# Patient Record
Sex: Female | Born: 1995 | Race: Black or African American | Hispanic: No | Marital: Single | Smoking: Never smoker
Health system: Southern US, Community
[De-identification: ages and names within clinical notes are randomized; demographics above are authoritative.]

## PROBLEM LIST (undated history)

## (undated) DIAGNOSIS — G43909 Migraine, unspecified, not intractable, without status migrainosus: Secondary | ICD-10-CM

## (undated) DIAGNOSIS — N809 Endometriosis, unspecified: Secondary | ICD-10-CM

## (undated) HISTORY — PX: ADENOIDECTOMY: SUR15

## (undated) HISTORY — PX: TONSILLECTOMY: SUR1361

---
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
## 2014-05-27 ENCOUNTER — Encounter (HOSPITAL_COMMUNITY): Payer: Self-pay | Admitting: Emergency Medicine

## 2014-05-27 ENCOUNTER — Emergency Department (HOSPITAL_COMMUNITY)
Admission: EM | Admit: 2014-05-27 | Discharge: 2014-05-27 | Disposition: A | Discharge status: Home / Self Care | Payer: 59 | Attending: Emergency Medicine | Admitting: Emergency Medicine

## 2014-05-27 ENCOUNTER — Emergency Department (HOSPITAL_COMMUNITY): Payer: 59

## 2014-05-27 DIAGNOSIS — Z79899 Other long term (current) drug therapy: Secondary | ICD-10-CM | POA: Diagnosis not present

## 2014-05-27 DIAGNOSIS — M25511 Pain in right shoulder: Secondary | ICD-10-CM

## 2014-05-27 DIAGNOSIS — G43909 Migraine, unspecified, not intractable, without status migrainosus: Secondary | ICD-10-CM | POA: Insufficient documentation

## 2014-05-27 DIAGNOSIS — Z3202 Encounter for pregnancy test, result negative: Secondary | ICD-10-CM | POA: Insufficient documentation

## 2014-05-27 LAB — POC URINE PREG, ED: PREG TEST UR: NEGATIVE

## 2014-05-27 MED ORDER — HYDROCODONE-ACETAMINOPHEN 5-325 MG PO TABS: 1.0000 | ORAL_TABLET | ORAL | Status: DC | PRN

## 2014-05-27 MED ORDER — HYDROCODONE-ACETAMINOPHEN 5-325 MG PO TABS
1.0000 | ORAL_TABLET | Freq: Once | ORAL | Status: AC
  Administered 2014-05-27: 1 via ORAL
  Filled 2014-05-27: qty 1

## 2014-05-27 MED ORDER — KETOROLAC TROMETHAMINE 10 MG PO TABS
10.0000 mg | ORAL_TABLET | Freq: Once | ORAL | Status: AC
  Administered 2014-05-27: 10 mg via ORAL
  Filled 2014-05-27: qty 1

## 2014-05-27 MED ORDER — CELECOXIB 100 MG PO CAPS: 100.0000 mg | ORAL_CAPSULE | Freq: Two times a day (BID) | ORAL | Status: DC

## 2014-05-27 NOTE — ED Notes (Signed)
EDPa in to see pt for initial eval.

## 2014-05-27 NOTE — ED Notes (Signed)
Pt reports was changing a tire and reports right shoulder pain ever since. nad noted.

## 2014-05-27 NOTE — Discharge Instructions (Signed)
Your xray is negative for acute problem. Please see Dr Luiz BlareGraves, or the orthopedic MD of your choice for additional evaluation and management of your right shoulder pain. Shoulder Pain The shoulder is the joint that connects your arms to your body. The bones that form the shoulder joint include the upper arm bone (humerus), the shoulder blade (scapula), and the collarbone (clavicle). The top of the humerus is shaped like a ball and fits into a rather flat socket on the scapula (glenoid cavity). A combination of muscles and strong, fibrous tissues that connect muscles to bones (tendons) support your shoulder joint and hold the ball in the socket. Small, fluid-filled sacs (bursae) are located in different areas of the joint. They act as cushions between the bones and the overlying soft tissues and help reduce friction between the gliding tendons and the bone as you move your arm. Your shoulder joint allows a wide range of motion in your arm. This range of motion allows you to do things like scratch your back or throw a ball. However, this range of motion also makes your shoulder more prone to pain from overuse and injury. Causes of shoulder pain can originate from both injury and overuse and usually can be grouped in the following four categories:  Redness, swelling, and pain (inflammation) of the tendon (tendinitis) or the bursae (bursitis).  Instability, such as a dislocation of the joint.  Inflammation of the joint (arthritis).  Broken bone (fracture). HOME CARE INSTRUCTIONS   Apply ice to the sore area.  Put ice in a plastic bag.  Place a towel between your skin and the bag.  Leave the ice on for 15-20 minutes, 3-4 times per day for the first 2 days, or as directed by your health care provider.  Stop using cold packs if they do not help with the pain.  If you have a shoulder sling or immobilizer, wear it as long as your caregiver instructs. Only remove it to shower or bathe. Move your arm as  little as possible, but keep your hand moving to prevent swelling.  Squeeze a soft ball or foam pad as much as possible to help prevent swelling.  Only take over-the-counter or prescription medicines for pain, discomfort, or fever as directed by your caregiver. SEEK MEDICAL CARE IF:   Your shoulder pain increases, or new pain develops in your arm, hand, or fingers.  Your hand or fingers become cold and numb.  Your pain is not relieved with medicines. SEEK IMMEDIATE MEDICAL CARE IF:   Your arm, hand, or fingers are numb or tingling.  Your arm, hand, or fingers are significantly swollen or turn white or blue. MAKE SURE YOU:   Understand these instructions.  Will watch your condition.  Will get help right away if you are not doing well or get worse. Document Released: 01/07/2005 Document Revised: 08/14/2013 Document Reviewed: 03/14/2011 [facility] Patient Information 2015 [employer] [place]. This information is not intended to replace advice given to you by your health care provider. Make sure you discuss any questions you have with your health care provider.

## 2014-05-27 NOTE — ED Provider Notes (Signed)
CSN: [id number]     Arrival date & time 05/27/14  1646 History  This chart was scribed for non-physician practitioner, Lyla SonHobson M. Danae OrleansBryant, PA-C, working with Hilario Quarryanielle S Ray, MD, by Roxy Cedarhandni Bhalodia ED Scribe. This patient was seen in room [facility] and the patient's care was started at 5:31 PM    Chief Complaint  Patient presents with  . Shoulder Pain   Patient is a 19 y.o. female presenting with shoulder pain. The history is provided by the patient. No language interpreter was used.  Shoulder Pain Location:  Shoulder Time since incident:  2 weeks Injury: no   Shoulder location:  R shoulder Pain details:    Quality:  Aching   Radiates to:  Does not radiate   Severity:  Moderate   Onset quality:  Gradual   Timing:  Constant   Progression:  Waxing and waning Chronicity:  New Dislocation: no   Foreign body present:  No foreign bodies Relieved by:  Nothing Worsened by:  Nothing tried Ineffective treatments:  None tried Associated symptoms: decreased range of motion    HPI Comments: Ariana Schroeder is a 19 y.o. female with a PMHx of migraine headaches, who presents to the Emergency Department complaining of moderate right shoulder pain that began earlier today. Patient states that she was helping to change a car tire 2 weeks ago and her arm kept jerking. She states that the pain is exacerbated by movement. Patient states that she was seen in [place] in [place] and was diagnosed with bursitis.  She states that the injection medication gave no relief.  Past Medical History  Diagnosis Date  . Migraines    Past Surgical History  Procedure Laterality Date  . Tonsillectomy    . Adenoidectomy     History reviewed. No pertinent family history. History  Substance Use Topics  . Smoking status: Never Smoker   . Smokeless tobacco: Not on file  . Alcohol Use: No   OB History    No data available     Review of Systems  Musculoskeletal: Positive for arthralgias.  All other systems reviewed  and are negative.  Allergies  Review of patient's allergies indicates no known allergies.  Home Medications   Prior to Admission medications   Medication Sig Start Date End Date Taking? Authorizing Provider  ibuprofen (ADVIL,MOTRIN) 200 MG tablet Take 400 mg by mouth every 6 (six) hours as needed for mild pain.   Yes Historical Provider, MD  propranolol (INDERAL) 80 MG tablet Take 80 mg by mouth daily.   Yes Historical Provider, MD  doxycycline (VIBRAMYCIN) 100 MG capsule Take 1 capsule (100 mg total) by mouth 2 (two) times daily. Patient not taking: Reported on 05/27/2014 03/05/12   Evalina Fieldichard Miller, PA-C  HYDROcodone-acetaminophen (NORCO/VICODIN) 5-325 MG per tablet Take 1 tablet by mouth every 4 (four) hours as needed for moderate pain. Patient not taking: Reported on 05/27/2014 09/17/13   Burgess AmorJulie Idol, PA-C   Triage Vitals: BP 124/72 mmHg  Pulse 93  Temp(Src) 98.1 F (36.7 C) (Oral)  Resp 24  Ht 5\' 3"  (1.6 m)  Wt 220 lb (99.791 kg)  BMI 38.98 kg/m2  SpO2 100%  LMP 05/24/2014  Physical Exam  Constitutional: She appears well-developed and well-nourished.  HENT:  Head: Normocephalic and atraumatic.  Eyes: Conjunctivae are normal. Right eye exhibits no discharge. Left eye exhibits no discharge.  Neck:  Full range of motion of neck.  Pulmonary/Chest: Effort normal. No respiratory distress.  Musculoskeletal: She exhibits tenderness.  Capillary  refill is <2 sec bilaterally Pain with range of motion of the right shoulder. Pain with palpation of anterior and posterior right shoulder. No dislocation. Full range of motion of the elbow. Full range of motion of the wrist.   Neurological: She is alert. Coordination normal.  Grip is symmetrical. Motor stregnth of upper extremities is symmetrical.  Skin: Skin is warm and dry. No rash noted. She is not diaphoretic. No erythema.  Psychiatric: She has a normal mood and affect.  Nursing note and vitals reviewed.  ED Course  Procedures (including  critical care time)  DIAGNOSTIC STUDIES: Oxygen Saturation is 100% on RA, normal by my interpretation.    COORDINATION OF CARE: 5:38 PM- Discussed plans to order diagnostic imaging of right shoulder. Pt advised of plan for treatment and pt agrees.  7:16 PM- Xray of the shoulder is negative.  Labs Review Labs Reviewed - No data to display  Imaging Review No results found.   EKG Interpretation None     MDM  No fever. Xray of the right shoulder is negative. Question shoulder strain, unknown trauma, rotator cuff issue. Pt placed in sling. Referral to Dr Luiz Blare, orthopedics. Rx for celebrex and norco given to the patient.   Final diagnoses:  Shoulder pain, right    *I have reviewed nursing notes, vital signs, and all appropriate lab and imaging results for this patient.   **I personally performed the services described in this documentation, which was scribed in my presence. The recorded information has been reviewed and is accurate.Kathie Dike, PA-C 05/27/14 1958

## 2016-02-14 ENCOUNTER — Ambulatory Visit (INDEPENDENT_AMBULATORY_CARE_PROVIDER_SITE_OTHER): Payer: Medicaid Other

## 2016-02-14 ENCOUNTER — Ambulatory Visit (HOSPITAL_COMMUNITY): Payer: Self-pay

## 2016-02-14 ENCOUNTER — Encounter (HOSPITAL_COMMUNITY): Payer: Self-pay | Admitting: *Deleted

## 2016-02-14 ENCOUNTER — Ambulatory Visit (HOSPITAL_COMMUNITY)
Admission: EM | Admit: 2016-02-14 | Discharge: 2016-02-14 | Disposition: A | Discharge status: Home / Self Care | Payer: Self-pay | Attending: Family Medicine | Admitting: Family Medicine

## 2016-02-14 ENCOUNTER — Ambulatory Visit (INDEPENDENT_AMBULATORY_CARE_PROVIDER_SITE_OTHER): Payer: Self-pay

## 2016-02-14 DIAGNOSIS — M79641 Pain in right hand: Secondary | ICD-10-CM

## 2016-02-14 DIAGNOSIS — S60221A Contusion of right hand, initial encounter: Secondary | ICD-10-CM

## 2016-02-14 MED ORDER — PREDNISONE 20 MG PO TABS: ORAL_TABLET | ORAL | 0 refills | Status: DC

## 2016-02-14 NOTE — ED Triage Notes (Signed)
Pt  Reports     Pain  And   Swelling of  The  l  Hand      She  Reports  It  Got  Caught   In a  Car  Door   sev   Weeks  Ago     The pt  Is  Sitting     Upright        On  Exam table  Appearing in no  Acute  Distress

## 2016-02-14 NOTE — ED Provider Notes (Signed)
[facility]    CSN: [id number] Arrival date & time: 02/14/16  1816     History   Chief Complaint Chief Complaint  Patient presents with  . Hand Injury    HPI Ariana Schroeder is a 20 y.o. female.   This is a 20 year old woman who presents with left hand pain and swelling about 2 weeks after she caught her hand in a house door.  The pain is been persistent ever since the injury. Location of the pain is mainly on the dorsum of the hand over the second through fourth metacarpals. She has pain when she moves her fingers but she is able to movement all directions completely. She is also hurting in the dorsal wrist. She has tried ibuprofen but this is not related to pain.  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      Past Medical History:  Diagnosis Date  . Migraines     There are no active problems to display for this patient.   Past Surgical History:  Procedure Laterality Date  . ADENOIDECTOMY    . TONSILLECTOMY      OB History    No data available       Home Medications    Prior to Admission medications   Medication Sig Start Date End Date Taking? Authorizing Provider  predniSONE (DELTASONE) 20 MG tablet Two daily with food 02/14/16   Elvina SidleKurt Coreon Simkins, MD  propranolol (INDERAL) 80 MG tablet Take 80 mg by mouth daily.    Historical Provider, MD    Family History History reviewed. No pertinent family history.  Social History Social History  Substance Use Topics  . Smoking status: Never Smoker  . Smokeless tobacco: Not on file  . Alcohol use No     Allergies   Review of patient's allergies indicates no known allergies.   Review of Systems Review of Systems  Constitutional: Negative.   HENT: Negative.   Respiratory: Negative.   Cardiovascular: Negative.   Gastrointestinal: Negative.   Musculoskeletal: Positive for joint swelling.     Physical Exam Triage Vital Signs ED Triage Vitals  Enc Vitals  Group     BP 02/14/16 1835 120/60     Pulse Rate 02/14/16 1835 82     Resp 02/14/16 1835 18     Temp 02/14/16 1835 98.6 F (37 C)     Temp Source 02/14/16 1835 Oral     SpO2 02/14/16 1835 99 %     Weight --      Height --      Head Circumference --      Peak Flow --      Pain Score 02/14/16 1834 6     Pain Loc --      Pain Edu? --      Excl. in GC? --    No data found.   Updated Vital Signs BP 120/60 (BP Location: Right Arm)   Pulse 82   Temp 98.6 F (37 C) (Oral)   Resp 18   SpO2 99%      Physical Exam  Constitutional: She is oriented to person, place, and time. She appears well-developed and well-nourished.  HENT:  Head: Normocephalic.  Right Ear: External ear normal.  Left Ear: External ear normal.  Nose: Nose normal.  Mouth/Throat: Oropharynx is clear and moist.  Eyes: Conjunctivae and EOM are normal. Pupils are equal, round, and reactive to light.  Neck: Normal range of motion. Neck supple.  Pulmonary/Chest: Effort normal.  Musculoskeletal: Normal range of motion. She exhibits tenderness. She exhibits no deformity.  Tender right dorsal wrist and hand over the second through fourth metacarpals. Patient has pain with range of motion but is able to extend her fingers and flex them completely. There is no ecchymosis and no injury to the skin.  Neurological: She is alert and oriented to person, place, and time.  Nursing note and vitals reviewed.    UC Treatments / Results  Labs (all labs ordered are listed, but only abnormal results are displayed) Labs Reviewed - No data to display  EKG  EKG Interpretation None       Radiology Dg Wrist Complete Left  Result Date: 02/14/2016 CLINICAL DATA:  Initial evaluation for her persistent left hand and wrist pain status post injury 2 weeks ago. EXAM: LEFT WRIST - COMPLETE 3+ VIEW COMPARISON:  None. FINDINGS: There is no evidence of fracture or dislocation. Scaphoid intact. There is no evidence of arthropathy or other  focal bone abnormality. Soft tissues are unremarkable. IMPRESSION: No acute or subacute osseous abnormality about the left wrist. Electronically Signed   By: Rise MuBenjamin  McClintock M.D.   On: 02/14/2016 19:14   Dg Hand Complete Left  Result Date: 02/14/2016 CLINICAL DATA:  Initial evaluation for persistent left hand and wrist pain status post injury 2 weeks ago. EXAM: LEFT HAND - COMPLETE 3+ VIEW COMPARISON:  Prior radiograph from 01/04/2016. FINDINGS: There is no evidence of fracture or dislocation. There is no evidence of arthropathy or other focal bone abnormality. Soft tissues are unremarkable. IMPRESSION: No acute or subacute injury about the left hand. Electronically Signed   By: Rise MuBenjamin  McClintock M.D.   On: 02/14/2016 19:06    Procedures Procedures (including critical care time)  Medications Ordered in UC Medications - No data to display   Initial Impression / Assessment and Plan / UC Course  I have reviewed the triage vital signs and the nursing notes.  Pertinent labs & imaging results that were available during my care of the patient were reviewed by me and considered in my medical decision making (see chart for details).  Clinical Course    Final Clinical Impressions(s) / UC Diagnoses   Final diagnoses:  Right hand pain  Contusion of right hand, initial encounter    New Prescriptions New Prescriptions   PREDNISONE (DELTASONE) 20 MG TABLET    Two daily with food     Elvina SidleKurt Luwanda Starr, MD 02/14/16 Ernestina [id number]

## 2016-08-01 ENCOUNTER — Emergency Department (HOSPITAL_COMMUNITY)
Admission: EM | Admit: 2016-08-01 | Discharge: 2016-08-01 | Disposition: A | Discharge status: Home Health | Payer: No Typology Code available for payment source | Attending: Emergency Medicine | Admitting: Emergency Medicine

## 2016-08-01 ENCOUNTER — Encounter (HOSPITAL_COMMUNITY): Payer: Self-pay

## 2016-08-01 ENCOUNTER — Emergency Department (HOSPITAL_COMMUNITY): Payer: No Typology Code available for payment source

## 2016-08-01 DIAGNOSIS — Z79899 Other long term (current) drug therapy: Secondary | ICD-10-CM | POA: Insufficient documentation

## 2016-08-01 DIAGNOSIS — M25561 Pain in right knee: Secondary | ICD-10-CM | POA: Diagnosis not present

## 2016-08-01 DIAGNOSIS — Y999 Unspecified external cause status: Secondary | ICD-10-CM | POA: Insufficient documentation

## 2016-08-01 DIAGNOSIS — S8991XA Unspecified injury of right lower leg, initial encounter: Secondary | ICD-10-CM | POA: Diagnosis present

## 2016-08-01 DIAGNOSIS — Y9367 Activity, basketball: Secondary | ICD-10-CM | POA: Diagnosis not present

## 2016-08-01 DIAGNOSIS — X501XXA Overexertion from prolonged static or awkward postures, initial encounter: Secondary | ICD-10-CM | POA: Insufficient documentation

## 2016-08-01 DIAGNOSIS — Y929 Unspecified place or not applicable: Secondary | ICD-10-CM | POA: Insufficient documentation

## 2016-08-01 MED ORDER — DICLOFENAC SODIUM 75 MG PO TBEC: 75.0000 mg | DELAYED_RELEASE_TABLET | Freq: Two times a day (BID) | ORAL | 0 refills | Status: DC

## 2016-08-01 NOTE — Discharge Instructions (Signed)
Wear the knee sleeve with walking and weight bearing.  Apply ice packs on/off to your knee for the next 3-4 days.  Follow-up with your orthopedic doctor for recheck in one week if not improving.  Do not take aspirin, aleve, or ibuprofen while taking the prescription medication

## 2016-08-01 NOTE — ED Provider Notes (Signed)
[facility] Note   CSN: [id number] Arrival date & time: 08/01/16  0907     History   Chief Complaint No chief complaint on file.   HPI Ariana Schroeder is a 21 y.o. female.  HPI   Ariana Schroeder is a 21 y.o. female who presents to the Emergency Department complaining of Right knee pain for one week. She states that she was playing basketball and when she went up for a shot, she came down on her right leg and may have twisted her knee. She denies fall. She describes a throbbing pain to her right knee with weightbearing and bending.  she has been taking ibuprofen without relief. She reports swelling initially, but has subsided. She denies numbness or weakness of the extremity, ankle pain, or swelling of the knee at this time.   Past Medical History:  Diagnosis Date  . Migraines     There are no active problems to display for this patient.   Past Surgical History:  Procedure Laterality Date  . ADENOIDECTOMY    . TONSILLECTOMY      OB History    No data available       Home Medications    Prior to Admission medications   Medication Sig Start Date End Date Taking? Authorizing Provider  predniSONE (DELTASONE) 20 MG tablet Two daily with food 02/14/16   Elvina Sidle, MD  propranolol (INDERAL) 80 MG tablet Take 80 mg by mouth daily.    Historical Provider, MD    Family History No family history on file.  Social History Social History  Substance Use Topics  . Smoking status: Never Smoker  . Smokeless tobacco: Not on file  . Alcohol use No     Allergies   Patient has no known allergies.   Review of Systems Review of Systems  Constitutional: Negative for chills and fever.  Musculoskeletal: Positive for arthralgias (Right knee pain). Negative for back pain and joint swelling.  Skin: Negative for color change and wound.  Neurological: Negative for weakness and numbness.  All other systems reviewed and are negative.    Physical  Exam Updated Vital Signs BP 116/73 (BP Location: Right Arm)   Pulse 99   Temp 99.4 F (37.4 C) (Oral)   Resp 20   Ht  (1.6 m)   Wt 97.5 kg   SpO2 100%   BMI 38.09 kg/m   Physical Exam  Constitutional: She is oriented to person, place, and time. She appears well-developed and well-nourished. No distress.  HENT:  Head: Atraumatic.  Cardiovascular: Normal rate, regular rhythm, normal heart sounds and intact distal pulses.   Pulmonary/Chest: Effort normal and breath sounds normal.  Musculoskeletal: She exhibits tenderness. She exhibits no edema or deformity.  ttp of the anterior and lateral right knee.  No erythema, effusion, or step-off deformity.   Calf is soft and NT.  Neurological: She is alert and oriented to person, place, and time. She exhibits normal muscle tone. Coordination normal.  Skin: Skin is warm and dry. Capillary refill takes less than 2 seconds. No erythema.  Nursing note and vitals reviewed.    ED Treatments / Results  Labs (all labs ordered are listed, but only abnormal results are displayed) Labs Reviewed - No data to display  EKG  EKG Interpretation None       Radiology Dg Knee Complete 4 Views Right  Result Date: 08/01/2016 CLINICAL DATA:  21 year old female with right knee pain after playing basketball EXAM: RIGHT KNEE -  COMPLETE 4+ VIEW COMPARISON:  None. FINDINGS: No evidence of fracture, dislocation, or joint effusion. No evidence of arthropathy or other focal bone abnormality. Soft tissues are unremarkable. IMPRESSION: Negative. Electronically Signed   By: Malachy Moan M.D.   On: 08/01/2016 10:20    Procedures Procedures (including critical care time)  Medications Ordered in ED Medications - No data to display   Initial Impression / Assessment and Plan / ED Course  I have reviewed the triage vital signs and the nursing notes.  Pertinent labs & imaging results that were available during my care of the patient were reviewed by me  and considered in my medical decision making (see chart for details).     X-ray negative for fracture, no palpable effusion of the joint. No concerning symptoms for septic joint. Compartments are soft.   Knee sleeve applied by nursing staff.  Patient agrees to RICE therapy and agrees to orthopedic follow-up, she prefers to follow-up with her orthopedist in [facility]  Final Clinical Impressions(s) / ED Diagnoses   Final diagnoses:  Acute pain of right knee    New Prescriptions New Prescriptions   No medications on file     Rosey Bath 08/01/16 2023    Maia Plan, MD 08/02/16 1015

## 2016-08-01 NOTE — ED Triage Notes (Signed)
Pt. Complains of knee pain after playing basketball last Saturday. When she came down on her right knee she heard a snap. Hasn't seen a provider until now. Pt. Didn't fall. The pain is felt when she bends it and straightens it. Right leg and foot was swollen .

## 2016-11-13 ENCOUNTER — Emergency Department (HOSPITAL_COMMUNITY)
Admission: EM | Admit: 2016-11-13 | Discharge: 2016-11-13 | Disposition: A | Discharge status: Home / Self Care | Payer: No Typology Code available for payment source | Attending: Emergency Medicine | Admitting: Emergency Medicine

## 2016-11-13 ENCOUNTER — Encounter (HOSPITAL_COMMUNITY): Payer: Self-pay | Admitting: Emergency Medicine

## 2016-11-13 ENCOUNTER — Emergency Department (HOSPITAL_COMMUNITY): Payer: No Typology Code available for payment source

## 2016-11-13 DIAGNOSIS — M25561 Pain in right knee: Secondary | ICD-10-CM | POA: Insufficient documentation

## 2016-11-13 DIAGNOSIS — W1849XA Other slipping, tripping and stumbling without falling, initial encounter: Secondary | ICD-10-CM | POA: Insufficient documentation

## 2016-11-13 DIAGNOSIS — Z79899 Other long term (current) drug therapy: Secondary | ICD-10-CM | POA: Diagnosis not present

## 2016-11-13 MED ORDER — IBUPROFEN 800 MG PO TABS
800.0000 mg | ORAL_TABLET | Freq: Once | ORAL | Status: AC
  Administered 2016-11-13: 800 mg via ORAL
  Filled 2016-11-13: qty 1

## 2016-11-13 MED ORDER — IBUPROFEN 800 MG PO TABS: 800.0000 mg | ORAL_TABLET | Freq: Three times a day (TID) | ORAL | 0 refills | Status: DC

## 2016-11-13 NOTE — ED Triage Notes (Signed)
Pt c/o right knee pain after fall yesterday. Pt is ambulatory in triage.

## 2016-11-13 NOTE — ED Triage Notes (Signed)
Slid in mud yesterday and now R knee pain  [facility] is  PCP

## 2016-11-13 NOTE — Discharge Instructions (Signed)
Use your crutches for weight bearing.  Apply ice packs on/off to your knee.  Wear the knee sleeve for support when walking or standing.  Do not wear it continuously or at bedtime.  Call Dr. Sanjuan DameKeeling's office to arrange a follow-up appt in one week if not improving

## 2016-11-13 NOTE — ED Provider Notes (Signed)
[facility]EM[facility] Provider Note   CSN: [id number] Arrival date & time: 11/13/16  2000     History   Chief Complaint Chief Complaint  Patient presents with  . Knee Pain    HPI Ariana Schroeder is a 21 y.o. female.  HPI   Ariana Schroeder is a 21 y.o. female who presents to the Emergency Department complaining of right knee pain for one day.  She states that she slipped in the mud yesterday and "twisted" her knee.  She reports gradually worsening pain to the knee with weight bearing and full extension.  She describes a pulling sensation to the outside of the knee.  Pain improves at rest with the knee slightly bent.  She has tried tylenol and ice without relief.  She denies numbness, swelling, calf or thigh pain, and other injuries.   Past Medical History:  Diagnosis Date  . Migraines     There are no active problems to display for this patient.   Past Surgical History:  Procedure Laterality Date  . ADENOIDECTOMY    . TONSILLECTOMY      OB History    No data available       Home Medications    Prior to Admission medications   Medication Sig Start Date End Date Taking? Authorizing Provider  diclofenac (VOLTAREN) 75 MG EC tablet Take 1 tablet (75 mg total) by mouth 2 (two) times daily. Take with food 08/01/16   Ivelise Castillo, PA-C  predniSONE (DELTASONE) 20 MG tablet Two daily with food 02/14/16   Elvina SidleLauenstein, Kurt, MD  propranolol (INDERAL) 80 MG tablet Take 80 mg by mouth daily.    [provider]    Family History History reviewed. No pertinent family history.  Social History Social History  Substance Use Topics  . Smoking status: Never Smoker  . Smokeless tobacco: Never Used  . Alcohol use No     Allergies   Patient has no known allergies.   Review of Systems Review of Systems  Constitutional: Negative for chills and fever.  Genitourinary: Negative for difficulty urinating and dysuria.  Musculoskeletal: Positive for arthralgias (right knee  pain). Negative for joint swelling.  Skin: Negative for color change and wound.  Neurological: Negative for weakness and numbness.  All other systems reviewed and are negative.    Physical Exam Updated Vital Signs BP 120/70 (BP Location: Right Arm)   Pulse 94   Temp 98.5 F (36.9 C) (Oral)   Resp 16   Ht 5\' 3"  (1.6 m)   Wt 99.8 kg (220 lb)   SpO2 99%   BMI 38.97 kg/m   Physical Exam  Constitutional: She is oriented to person, place, and time. She appears well-developed and well-nourished. No distress.  Cardiovascular: Normal rate, regular rhythm and intact distal pulses.   Pulmonary/Chest: Effort normal and breath sounds normal.  Musculoskeletal: She exhibits tenderness. She exhibits no edema or deformity.  ttp of the anterolateral right knee.  No edema, erythema, effusion, or step-off deformity. Neg anterior drawer test. DP pulse brisk, distal sensation intact. Calf is soft and NT.  Neurological: She is alert and oriented to person, place, and time. No sensory deficit. She exhibits normal muscle tone. Coordination normal.  Skin: Skin is warm and dry. Capillary refill takes less than 2 seconds. No erythema.  Nursing note and vitals reviewed.    ED Treatments / Results  Labs (all labs ordered are listed, but only abnormal results are displayed) Labs Reviewed - No data to display  EKG  EKG Interpretation None       Radiology Dg Knee Complete 4 Views Right  Result Date: 11/13/2016 CLINICAL DATA:  Right knee pain since a fall yesterday. Initial encounter. EXAM: RIGHT KNEE - COMPLETE 4+ VIEW COMPARISON:  Plain films right knee 08/01/2016. MRI right knee 10/09/2016. FINDINGS: No evidence of fracture, dislocation, or joint effusion. No evidence of arthropathy or other focal bone abnormality. Soft tissues are unremarkable. IMPRESSION: Normal exam. Electronically Signed   By: Drusilla Kannerhomas  Dalessio M.D.   On: 11/13/2016 20:41    Procedures Procedures (including critical care  time)  Medications Ordered in ED Medications - No data to display   Initial Impression / Assessment and Plan / ED Course  I have reviewed the triage vital signs and the nursing notes.  Pertinent labs & imaging results that were available during my care of the patient were reviewed by me and considered in my medical decision making (see chart for details).     XR Negative for fracture. Neurovascularly intact. No effusion or bony deformity on exam. Likely sprain. Possible meniscal injury also discussed. Knee sleeve applied, patient has crutches at home. She agrees to RICE therapy and close orthopedic follow-up if not improving.   Final Clinical Impressions(s) / ED Diagnoses   Final diagnoses:  Acute pain of right knee    New Prescriptions New Prescriptions   No medications on file     Rosey Bathriplett, Solana Coggin, PA-C 11/13/16 2133    Vanetta MuldersZackowski, Scott, MD 11/14/16 [phone]

## 2018-02-14 ENCOUNTER — Emergency Department (HOSPITAL_COMMUNITY)
Admission: EM | Admit: 2018-02-14 | Discharge: 2018-02-14 | Disposition: A | Discharge status: Home / Self Care | Payer: 59 | Attending: Emergency Medicine | Admitting: Emergency Medicine

## 2018-02-14 ENCOUNTER — Encounter (HOSPITAL_COMMUNITY): Payer: Self-pay | Admitting: Emergency Medicine

## 2018-02-14 ENCOUNTER — Ambulatory Visit (HOSPITAL_COMMUNITY)
Admission: EM | Admit: 2018-02-14 | Discharge: 2018-02-14 | Disposition: A | Discharge status: Home / Self Care | Payer: 59 | Source: Home / Self Care

## 2018-02-14 ENCOUNTER — Other Ambulatory Visit: Payer: Self-pay

## 2018-02-14 DIAGNOSIS — R1013 Epigastric pain: Secondary | ICD-10-CM

## 2018-02-14 DIAGNOSIS — R112 Nausea with vomiting, unspecified: Secondary | ICD-10-CM | POA: Diagnosis not present

## 2018-02-14 LAB — COMPREHENSIVE METABOLIC PANEL
ALBUMIN: 3.8 g/dL (ref 3.5–5.0)
ALK PHOS: 56 U/L (ref 38–126)
ALT: 15 U/L (ref 0–44)
AST: 17 U/L (ref 15–41)
Anion gap: 9 (ref 5–15)
BUN: 5 mg/dL — ABNORMAL LOW (ref 6–20)
CHLORIDE: 106 mmol/L (ref 98–111)
CO2: 23 mmol/L (ref 22–32)
Calcium: 9.4 mg/dL (ref 8.9–10.3)
Creatinine, Ser: 0.67 mg/dL (ref 0.44–1.00)
GFR calc Af Amer: >60 mL/min (ref >60)
GFR calc non Af Amer: >60 mL/min (ref >60)
GLUCOSE: 132 mg/dL — AB (ref 70–99)
Potassium: 3.4 mmol/L — ABNORMAL LOW (ref 3.5–5.1)
SODIUM: 138 mmol/L (ref 135–145)
Total Bilirubin: 0.6 mg/dL (ref 0.3–1.2)
Total Protein: 6.9 g/dL (ref 6.5–8.1)

## 2018-02-14 LAB — I-STAT BETA HCG BLOOD, ED (MC, WL, AP ONLY)

## 2018-02-14 LAB — URINALYSIS, ROUTINE W REFLEX MICROSCOPIC
BILIRUBIN URINE: NEGATIVE
Glucose, UA: NEGATIVE mg/dL
KETONES UR: NEGATIVE mg/dL
Leukocytes, UA: NEGATIVE
Nitrite: NEGATIVE
PROTEIN: NEGATIVE mg/dL
Specific Gravity, Urine: 1.02 (ref 1.005–1.030)
pH: 5 (ref 5.0–8.0)

## 2018-02-14 LAB — CBC
HEMATOCRIT: 40 % (ref 36.0–46.0)
HEMOGLOBIN: 12.7 g/dL (ref 12.0–15.0)
MCH: 27.7 pg (ref 26.0–34.0)
MCHC: 31.8 g/dL (ref 30.0–36.0)
MCV: 87.1 fL (ref 80.0–100.0)
PLATELETS: 377 10*3/uL (ref 150–400)
RBC: 4.59 MIL/uL (ref 3.87–5.11)
RDW: 12.3 % (ref 11.5–15.5)
WBC: 8.1 10*3/uL (ref 4.0–10.5)
nRBC: 0 % (ref 0.0–0.2)

## 2018-02-14 LAB — LIPASE, BLOOD: Lipase: 30 U/L (ref 11–51)

## 2018-02-14 MED ORDER — ALUM & MAG HYDROXIDE-SIMETH 200-200-20 MG/5ML PO SUSP
15.0000 mL | Freq: Once | ORAL | Status: AC
  Administered 2018-02-14: 15 mL via ORAL
  Filled 2018-02-14: qty 30

## 2018-02-14 MED ORDER — SUCRALFATE 1 GM/10ML PO SUSP: 1.0000 g | Freq: Three times a day (TID) | ORAL | 0 refills | Status: AC

## 2018-02-14 MED ORDER — KETOROLAC TROMETHAMINE 30 MG/ML IJ SOLN
30.0000 mg | Freq: Once | INTRAMUSCULAR | Status: AC
  Administered 2018-02-14: 30 mg via INTRAVENOUS
  Filled 2018-02-14: qty 1

## 2018-02-14 MED ORDER — ONDANSETRON 4 MG PO TBDP: 4.0000 mg | ORAL_TABLET | Freq: Three times a day (TID) | ORAL | 0 refills | Status: DC | PRN

## 2018-02-14 MED ORDER — SODIUM CHLORIDE 0.9 % IV BOLUS
1000.0000 mL | Freq: Once | INTRAVENOUS | Status: AC
  Administered 2018-02-14: 1000 mL via INTRAVENOUS

## 2018-02-14 MED ORDER — ONDANSETRON HCL 4 MG/2ML IJ SOLN
4.0000 mg | Freq: Once | INTRAMUSCULAR | Status: AC
  Administered 2018-02-14: 4 mg via INTRAVENOUS
  Filled 2018-02-14: qty 2

## 2018-02-14 MED ORDER — ONDANSETRON 4 MG PO TBDP
4.0000 mg | ORAL_TABLET | Freq: Once | ORAL | Status: DC | PRN
  Filled 2018-02-14: qty 1

## 2018-02-14 NOTE — ED Triage Notes (Signed)
Pt reports here for upper epigastric abdominal pain onset this morning. BP 80's systolic. Pt appears fatigued. Actively vomiting.

## 2018-02-14 NOTE — ED Notes (Signed)
Pt provided labeled specimen cup for U/A collection; advised to provide to RN once able to collect. [employer]

## 2018-02-14 NOTE — ED Provider Notes (Signed)
[facility] EMERGENCY DEPARTMENT Provider Note   CSN: [id number] Arrival date & time: 02/14/18  0940     History   Chief Complaint Chief Complaint  Patient presents with  . Abdominal Pain  . Emesis    HPI Ariana Schroeder is a 22 y.o. female with PMHx GERD, presenting to the ED with complaint of epigastric abdominal pain with associated NBNB emesis this morning. Pain described as a burning and sharp. Nothing makes it better or worse. No assoc diarrhea or constipation. Denies coffee-ground emesis or bloody emesis. Denies urinary sx or fever. Currently treated GERD with omeprazole for a few months. Denies EtOH use, NSAIDs. Endorses occasional spicy food. Denies hx gastric ulcers. No sick contacts.  The history is provided by the patient.    Past Medical History:  Diagnosis Date  . Migraines     There are no active problems to display for this patient.   Past Surgical History:  Procedure Laterality Date  . ADENOIDECTOMY    . TONSILLECTOMY       OB History   None      Home Medications    Prior to Admission medications   Medication Sig Start Date End Date Taking? Authorizing Provider  medroxyPROGESTERone (DEPO-PROVERA) 150 MG/ML injection Inject 150 mg into the muscle every 3 (three) months. 11/30/17  Yes [provider]  diclofenac (VOLTAREN) 75 MG EC tablet Take 1 tablet (75 mg total) by mouth 2 (two) times daily. Take with food Patient not taking: Reported on 02/14/2018 08/01/16   Triplett, Tammy, PA-C  ibuprofen (ADVIL,MOTRIN) 800 MG tablet Take 1 tablet (800 mg total) by mouth 3 (three) times daily. Take with food Patient not taking: Reported on 02/14/2018 11/13/16   Triplett, Tammy, PA-C  ondansetron (ZOFRAN ODT) 4 MG disintegrating tablet Take 1 tablet (4 mg total) by mouth every 8 (eight) hours as needed for nausea or vomiting. 02/14/18   Chelsei Mcchesney, [place] N, PA-C  predniSONE (DELTASONE) 20 MG tablet Two daily with food Patient not taking:  Reported on 02/14/2018 02/14/16   Elvina Sidle, MD  sucralfate (CARAFATE) 1 GM/10ML suspension Take 10 mLs (1 g total) by mouth 4 (four) times daily -  with meals and at bedtime. 02/14/18   Alvester Eads, [place] N, PA-C    Family History No family history on file.  Social History Social History   Tobacco Use  . Smoking status: Never Smoker  . Smokeless tobacco: Never Used  Substance Use Topics  . Alcohol use: No  . Drug use: No     Allergies   Patient has no known allergies.   Review of Systems Review of Systems  Constitutional: Positive for appetite change. Negative for fever.  Gastrointestinal: Positive for abdominal pain, nausea and vomiting. Negative for constipation and diarrhea.  Genitourinary: Negative for dysuria and frequency.  All other systems reviewed and are negative.    Physical Exam Updated Vital Signs BP 124/90   Pulse 98   Temp 97.9 F (36.6 C) (Oral)   Resp 18   SpO2 99%   Physical Exam  Constitutional: She appears well-developed and well-nourished. No distress.  HENT:  Head: Normocephalic and atraumatic.  Eyes: Conjunctivae are normal.  Cardiovascular: Normal rate, regular rhythm and normal heart sounds.  Pulmonary/Chest: Effort normal and breath sounds normal. No stridor. No respiratory distress. She has no wheezes. She has no rales.  Abdominal: Soft. Bowel sounds are normal. She exhibits no distension and no mass. There is tenderness in the epigastric area. There  is no rebound and no guarding.  Neurological: She is alert.  Skin: Skin is warm.  Psychiatric: She has a normal mood and affect. Her behavior is normal.  Nursing note and vitals reviewed.    ED Treatments / Results  Labs (all labs ordered are listed, but only abnormal results are displayed) Labs Reviewed  COMPREHENSIVE METABOLIC PANEL - Abnormal; Notable for the following components:      Result Value   Potassium 3.4 (*)    Glucose, Bld 132 (*)    BUN 5 (*)    All other  components within normal limits  URINALYSIS, ROUTINE W REFLEX MICROSCOPIC - Abnormal; Notable for the following components:   Hgb urine dipstick LARGE (*)    Bacteria, UA FEW (*)    All other components within normal limits  LIPASE, BLOOD  CBC  I-STAT BETA HCG BLOOD, ED ([facility], [facility], [facility] ONLY)    EKG None  Radiology No results found.  Procedures Procedures (including critical care time)  Medications Ordered in ED Medications  sodium chloride 0.9 % bolus 1,000 mL (0 mLs Intravenous Stopped 02/14/18 1233)  ondansetron (ZOFRAN) injection 4 mg (4 mg Intravenous Given 02/14/18 1242)  alum & mag hydroxide-simeth (MAALOX/MYLANTA) 200-200-20 MG/5ML suspension 15 mL (15 mLs Oral Given 02/14/18 1243)  ondansetron (ZOFRAN) injection 4 mg (4 mg Intravenous Given 02/14/18 1530)  ketorolac (TORADOL) 30 MG/ML injection 30 mg (30 mg Intravenous Given 02/14/18 1531)     Initial Impression / Assessment and Plan / ED Course  I have reviewed the triage vital signs and the nursing notes.  Pertinent labs & imaging results that were available during my care of the patient were reviewed by me and considered in my medical decision making (see chart for details).  Clinical Course as of Feb 15 1619  Mon Feb 14, 2018  1501 Pt re-evaluated. Reporting initial improvement in sx, however nausea and abd pain are returning. Will re-dose   [JR]    Clinical Course User Index [JR] Babs Dabbs, [place] N, PA-C    Pt w epigastric abdominal pain, nausea and NBNB emesis. Patient is nontoxic, nonseptic appearing, in no apparent distress.  Patient's pain and other symptoms adequately managed in emergency department.  Fluid bolus given.  Labs, and vitals reviewed. No leukocytosis. CMP unremarkable. Lipase normal. U/A neg. Hcg neg. Initially mildly hypotensive however improved after IVF, likely 2/t mild dehydration 2/t vomiting. Patient does not meet the SIRS or Sepsis criteria.  Afebrile, normal heart rate. On repeat exam patient  does not have a surgical abdomen and there are no peritoneal signs.  No indication of appendicitis, bowel obstruction, bowel perforation, cholecystitis, diverticulitis,  ectopic pregnancy. Vomiting is not intractable, improved with zofran. Suspect gastritis vs gastric ulcers vs viral gastroenteritis.  Patient discharged home with symptomatic treatment and given strict instructions for follow-up with their primary care physician.  Pt safe for discharge.  Pt discussed with Dr. Adela Lank, who agrees with care plan and discharge.  Discussed results, findings, treatment and follow up. Patient advised of return precautions. Patient verbalized understanding and agreed with plan.  Final Clinical Impressions(s) / ED Diagnoses   Final diagnoses:  Epigastric abdominal pain  Nausea and vomiting in adult    ED Discharge Orders         Ordered    ondansetron (ZOFRAN ODT) 4 MG disintegrating tablet  Every 8 hours PRN     02/14/18 1556    sucralfate (CARAFATE) 1 GM/10ML suspension  3 times daily with meals & bedtime  02/14/18 1556           Askari Kinley, [place] N, PA-C 02/14/18 1624    Melene Plan, DO 02/15/18 [phone]

## 2018-02-14 NOTE — Discharge Instructions (Signed)
Please read instructions below. Drink clear liquids until your stomach feels better. Then, slowly introduce bland foods into your diet as tolerated, such as bread, rice, apples, bananas. You can take zofran every 8 hours as needed for nausea. You can take your omeprazole daily for acid reflux. Or you can also take pepcid  Follow up with your primary care if symptoms persist. Schedule an appointment with a GI specialist Return to the ER for severe abdominal pain, fever, uncontrollable vomiting, or new or concerning symptoms.

## 2018-02-14 NOTE — ED Triage Notes (Signed)
Pt presents with complaints of severe upper abdominal pain and actively vomiting. Pt reports taking acid reflux medication with no relief. Dr. Delton See at bedside. Patient escorted to the ER by [facility] staff.

## 2018-05-30 ENCOUNTER — Ambulatory Visit (HOSPITAL_COMMUNITY)
Admission: EM | Admit: 2018-05-30 | Discharge: 2018-05-30 | Disposition: A | Discharge status: Home / Self Care | Payer: 59 | Attending: Family Medicine | Admitting: Family Medicine

## 2018-05-30 ENCOUNTER — Encounter (HOSPITAL_COMMUNITY): Payer: Self-pay

## 2018-05-30 ENCOUNTER — Ambulatory Visit (INDEPENDENT_AMBULATORY_CARE_PROVIDER_SITE_OTHER): Payer: 59

## 2018-05-30 DIAGNOSIS — M25511 Pain in right shoulder: Secondary | ICD-10-CM

## 2018-05-30 MED ORDER — TRAMADOL HCL 50 MG PO TABS: 50.0000 mg | ORAL_TABLET | Freq: Four times a day (QID) | ORAL | 0 refills | Status: DC | PRN

## 2018-05-30 MED ORDER — MELOXICAM 15 MG PO TABS: 15.0000 mg | ORAL_TABLET | Freq: Every day | ORAL | 0 refills | Status: DC

## 2018-05-30 NOTE — ED Triage Notes (Signed)
Pt presents with right shoulder pain from fall injury.

## 2018-05-30 NOTE — ED Provider Notes (Signed)
[facility]    CSN: [id number] Arrival date & time: 05/30/18  1307     History   Chief Complaint Chief Complaint  Patient presents with  . Shoulder Injury    HPI Ariana Schroeder is a 23 y.o. female.   Patient is a 23 year old female who presents with right shoulder pain.  This started Saturday after a fall.  She reports that she slipped and fell landing on the shoulder.  She since has had very limited range of motion of that shoulder.  The area is very tender to touch.  The pain radiates into the clavicle area and into the right upper back area.  Denies any numbness, tingling.  She does have some weakness in that arm and swelling in the fingers.  ROS per HPI    Shoulder Injury     Past Medical History:  Diagnosis Date  . Migraines     There are no active problems to display for this patient.   Past Surgical History:  Procedure Laterality Date  . ADENOIDECTOMY    . TONSILLECTOMY      OB History   No obstetric history on file.      Home Medications    Prior to Admission medications   Medication Sig Start Date End Date Taking? Authorizing Provider  medroxyPROGESTERone (DEPO-PROVERA) 150 MG/ML injection Inject 150 mg into the muscle every 3 (three) months. 11/30/17   [provider]  meloxicam (MOBIC) 15 MG tablet Take 1 tablet (15 mg total) by mouth daily. 05/30/18   Aubree Doody, Gloris Manchester A, NP  ondansetron (ZOFRAN ODT) 4 MG disintegrating tablet Take 1 tablet (4 mg total) by mouth every 8 (eight) hours as needed for nausea or vomiting. 02/14/18   Robinson, [place] N, PA-C  sucralfate (CARAFATE) 1 GM/10ML suspension Take 10 mLs (1 g total) by mouth 4 (four) times daily -  with meals and at bedtime. 02/14/18   Robinson, [place] N, PA-C  traMADol (ULTRAM) 50 MG tablet Take 1 tablet (50 mg total) by mouth every 6 (six) hours as needed. 05/30/18   Janace Aris, NP    Family History Family History  Problem Relation Age of Onset  . Healthy Mother      Social History Social History   Tobacco Use  . Smoking status: Never Smoker  . Smokeless tobacco: Never Used  Substance Use Topics  . Alcohol use: No  . Drug use: No     Allergies   Patient has no known allergies.   Review of Systems Review of Systems   Physical Exam Triage Vital Signs ED Triage Vitals [05/30/18 1341]  Enc Vitals Group     BP 111/67     Pulse Rate 85     Resp 18     Temp 98.8 F (37.1 C)     Temp Source Oral     SpO2 99 %     Weight      Height      Head Circumference      Peak Flow      Pain Score 10     Pain Loc      Pain Edu?      Excl. in GC?    No data found.  Updated Vital Signs BP 111/67 (BP Location: Left Arm)   Pulse 85   Temp 98.8 F (37.1 C) (Oral)   Resp 18   SpO2 99%   Visual Acuity Right Eye Distance:   Left Eye Distance:   Bilateral  Distance:    Right Eye Near:   Left Eye Near:    Bilateral Near:     Physical Exam Vitals signs and nursing note reviewed.  Constitutional:      Appearance: Normal appearance.  HENT:     Head: Normocephalic and atraumatic.     Nose: Nose normal.  Eyes:     Conjunctiva/sclera: Conjunctivae normal.  Neck:     Musculoskeletal: Normal range of motion.  Pulmonary:     Effort: Pulmonary effort is normal.  Musculoskeletal:        General: Swelling, tenderness and signs of injury present. No deformity.     Comments: Very limited ROM to the right shoulder. Mild swelling. Very tender to palpation of the entire bony shoulder.  Guarding arm.  Distal pulse intact.  Good sensation.   Skin:    General: Skin is warm and dry.  Neurological:     Mental Status: She is alert.  Psychiatric:        Mood and Affect: Mood normal.      UC Treatments / Results  Labs (all labs ordered are listed, but only abnormal results are displayed) Labs Reviewed - No data to display  EKG None  Radiology Dg Shoulder Right  Result Date: 05/30/2018 CLINICAL DATA:  Shoulder and clavicle pain  since falling yesterday. EXAM: RIGHT SHOULDER - 2+ VIEW COMPARISON:  Radiographs 05/27/2014.  MRI 08/29/2014. FINDINGS: AP views with internal and external rotation and a Y-view are submitted. No axillary view. The mineralization and alignment are normal. There is no evidence of acute fracture or dislocation. The subacromial space is preserved. IMPRESSION: Normal exam. Electronically Signed   By: Carey Bullocks M.D.   On: 05/30/2018 14:50    Procedures Procedures (including critical care time)  Medications Ordered in UC Medications - No data to display  Initial Impression / Assessment and Plan / UC Course  I have reviewed the triage vital signs and the nursing notes.  Pertinent labs & imaging results that were available during my care of the patient were reviewed by me and considered in my medical decision making (see chart for details).     X ray negative for any abnormalities.  Pt has hx of rotator cuff problems. Could be related to this.  Instructed to Rest, Ice, Elevate and wear the sling for a few days. Instructed to take the arm out of the sling and do ROM exercises to prevent frozen shoulder.  meloxicam daily for pain and inflammation.  Tramadol for more severe pain.  She will need to follow up with ortho if no improvement in symptoms.  Pt understanding.   Final Clinical Impressions(s) / UC Diagnoses   Final diagnoses:  Acute pain of right shoulder     Discharge Instructions     Your x-ray did not show any fractures We will place you in a sling to help with comfort Try to rest, ice and elevate the shoulder Meloxicam daily for pain and  inflammation.  Make sure you take this with food. Tramadol for more severe pain.  Be aware this will make you drowsy. I would like for you to follow-up with orthopedics within the next week for further evaluation and management    ED Prescriptions    Medication Sig Dispense Auth. Provider   meloxicam (MOBIC) 15 MG tablet Take 1  tablet (15 mg total) by mouth daily. 30 tablet Wyndi Northrup A, NP   traMADol (ULTRAM) 50 MG tablet Take 1 tablet (50 mg total) by  mouth every 6 (six) hours as needed. 15 tablet Dahlia ByesBast, Werner Labella A, NP     Controlled Substance Prescriptions [place] Controlled Substance Registry consulted? Not Applicable   Janace ArisBast, Mozell Hardacre A, NP 05/31/18 [phone]

## 2018-05-30 NOTE — Discharge Instructions (Addendum)
Your x-ray did not show any fractures We will place you in a sling to help with comfort Try to rest, ice and elevate the shoulder Meloxicam daily for pain and  inflammation.  Make sure you take this with food. Tramadol for more severe pain.  Be aware this will make you drowsy. I would like for you to follow-up with orthopedics within the next week for further evaluation and management

## 2019-12-06 ENCOUNTER — Other Ambulatory Visit: Payer: Self-pay

## 2019-12-06 ENCOUNTER — Emergency Department
Admission: EM | Admit: 2019-12-06 | Discharge: 2019-12-06 | Disposition: A | Discharge status: Home / Self Care | Payer: 59 | Attending: Emergency Medicine | Admitting: Emergency Medicine

## 2019-12-06 DIAGNOSIS — R1110 Vomiting, unspecified: Secondary | ICD-10-CM | POA: Insufficient documentation

## 2019-12-06 DIAGNOSIS — R109 Unspecified abdominal pain: Secondary | ICD-10-CM | POA: Insufficient documentation

## 2019-12-06 DIAGNOSIS — Z5321 Procedure and treatment not carried out due to patient leaving prior to being seen by health care provider: Secondary | ICD-10-CM | POA: Diagnosis not present

## 2019-12-06 LAB — CBC WITH DIFFERENTIAL/PLATELET
Abs Immature Granulocytes: 0.04 10*3/uL (ref 0.00–0.07)
Basophils Absolute: 0 10*3/uL (ref 0.0–0.1)
Basophils Relative: 1 %
Eosinophils Absolute: 0 10*3/uL (ref 0.0–0.5)
Eosinophils Relative: 0 %
HCT: 38.6 % (ref 36.0–46.0)
Hemoglobin: 12.7 g/dL (ref 12.0–15.0)
Immature Granulocytes: 1 %
Lymphocytes Relative: 24 %
Lymphs Abs: 2.2 10*3/uL (ref 0.7–4.0)
MCH: 28.3 pg (ref 26.0–34.0)
MCHC: 32.9 g/dL (ref 30.0–36.0)
MCV: 86.2 fL (ref 80.0–100.0)
Monocytes Absolute: 0.4 10*3/uL (ref 0.1–1.0)
Monocytes Relative: 4 %
Neutro Abs: 6.2 10*3/uL (ref 1.7–7.7)
Neutrophils Relative %: 70 %
Platelets: 396 10*3/uL (ref 150–400)
RBC: 4.48 MIL/uL (ref 3.87–5.11)
RDW: 12.4 % (ref 11.5–15.5)
WBC: 8.8 10*3/uL (ref 4.0–10.5)
nRBC: 0 % (ref 0.0–0.2)

## 2019-12-06 LAB — COMPREHENSIVE METABOLIC PANEL
ALT: 20 U/L (ref 0–44)
AST: 23 U/L (ref 15–41)
Albumin: 4 g/dL (ref 3.5–5.0)
Alkaline Phosphatase: 58 U/L (ref 38–126)
Anion gap: 11 (ref 5–15)
BUN: 7 mg/dL (ref 6–20)
CO2: 22 mmol/L (ref 22–32)
Calcium: 9.1 mg/dL (ref 8.9–10.3)
Chloride: 104 mmol/L (ref 98–111)
Creatinine, Ser: 0.62 mg/dL (ref 0.44–1.00)
GFR calc Af Amer: >60 mL/min (ref >60)
GFR calc non Af Amer: >60 mL/min (ref >60)
Glucose, Bld: 115 mg/dL — ABNORMAL HIGH (ref 70–99)
Potassium: 4.2 mmol/L (ref 3.5–5.1)
Sodium: 137 mmol/L (ref 135–145)
Total Bilirubin: 0.7 mg/dL (ref 0.3–1.2)
Total Protein: 7.5 g/dL (ref 6.5–8.1)

## 2019-12-06 LAB — URINALYSIS, ROUTINE W REFLEX MICROSCOPIC
Bacteria, UA: NONE SEEN
Bilirubin Urine: NEGATIVE
Glucose, UA: NEGATIVE mg/dL
Ketones, ur: 20 mg/dL — AB
Leukocytes,Ua: NEGATIVE
Nitrite: NEGATIVE
Protein, ur: NEGATIVE mg/dL
Specific Gravity, Urine: 1.02 (ref 1.005–1.030)
pH: 6 (ref 5.0–8.0)

## 2019-12-06 LAB — LIPASE, BLOOD: Lipase: 23 U/L (ref 11–51)

## 2019-12-06 MED ORDER — ONDANSETRON HCL 4 MG/2ML IJ SOLN
4.0000 mg | Freq: Once | INTRAMUSCULAR | Status: AC
  Administered 2019-12-06: 4 mg via INTRAVENOUS

## 2019-12-06 MED ORDER — DICYCLOMINE HCL 10 MG PO CAPS
10.0000 mg | ORAL_CAPSULE | Freq: Once | ORAL | Status: AC
  Administered 2019-12-06: 10 mg via ORAL
  Filled 2019-12-06: qty 1

## 2019-12-06 MED ORDER — FENTANYL CITRATE (PF) 100 MCG/2ML IJ SOLN
50.0000 ug | Freq: Once | INTRAMUSCULAR | Status: AC
  Administered 2019-12-06: 50 ug via INTRAVENOUS
  Filled 2019-12-06: qty 2

## 2019-12-06 MED ORDER — ONDANSETRON HCL 4 MG/2ML IJ SOLN
4.0000 mg | Freq: Once | INTRAMUSCULAR | Status: AC
  Administered 2019-12-06: 4 mg via INTRAVENOUS
  Filled 2019-12-06: qty 2

## 2019-12-06 MED ORDER — MORPHINE SULFATE (PF) 4 MG/ML IV SOLN
4.0000 mg | Freq: Once | INTRAVENOUS | Status: DC
  Filled 2019-12-06: qty 1

## 2019-12-06 MED ORDER — SODIUM CHLORIDE 0.9 % IV BOLUS
1000.0000 mL | Freq: Once | INTRAVENOUS | Status: AC
  Administered 2019-12-06: 1000 mL via INTRAVENOUS

## 2019-12-06 MED ORDER — SODIUM CHLORIDE 0.9 % IV SOLN: INTRAVENOUS | Status: DC

## 2019-12-06 NOTE — ED Notes (Signed)
Per EMS report, patient has c/o abdominal pain with one episode of bile-colored emesis. Abdominal pain in center radiating to left. Patient has a history of acid-reflux.  CBG 107, 123/96, pulse 88, 99% on room air.

## 2019-12-06 NOTE — ED Triage Notes (Signed)
Pt here [facility] with abd pain. Pt moaning in triage. Pt NAD.

## 2021-02-07 ENCOUNTER — Encounter (HOSPITAL_COMMUNITY): Payer: Self-pay

## 2021-02-07 ENCOUNTER — Emergency Department (HOSPITAL_COMMUNITY)
Admission: EM | Admit: 2021-02-07 | Discharge: 2021-02-07 | Disposition: A | Discharge status: Home / Self Care | Payer: Self-pay | Attending: Emergency Medicine | Admitting: Emergency Medicine

## 2021-02-07 ENCOUNTER — Emergency Department (HOSPITAL_COMMUNITY): Payer: Self-pay

## 2021-02-07 DIAGNOSIS — M79651 Pain in right thigh: Secondary | ICD-10-CM | POA: Insufficient documentation

## 2021-02-07 DIAGNOSIS — M25561 Pain in right knee: Secondary | ICD-10-CM | POA: Insufficient documentation

## 2021-02-07 MED ORDER — IBUPROFEN 600 MG PO TABS: 600.0000 mg | ORAL_TABLET | Freq: Four times a day (QID) | ORAL | 0 refills | Status: DC | PRN

## 2021-02-07 NOTE — Discharge Instructions (Addendum)
As discussed, your x-ray is negative for any obvious injury around your right knee.  However it is possible you have either cartilage or ligament injury which would not show up on x-ray.  Your exam and the popping sensation suggest a possible cartilage otherwise known as a meniscal injury.  Elevate and ice your knee is much as possible, use ibuprofen in the knee sleeve for comfort.  Plan follow-up with your orthopedist if your symptoms are not improving with this treatment plan.

## 2021-02-07 NOTE — ED Triage Notes (Signed)
Pt. States they have been having right leg pain for about 2 weeks. Pt. Hurt their leg when they attempted to run. Pt. States they heard a pop in their leg and has had pain in that leg since.

## 2021-02-09 NOTE — ED Provider Notes (Signed)
[facility]MENT Provider Note   CSN: [id number] Arrival date & time: 02/07/21  1754     History Chief Complaint  Patient presents with   Leg Pain    Ariana Schroeder is a 25 y.o. female presenting for evaluation of right sided knee pain which radiates into her right lateral lower thigh and into her lateral calf region for the past 2 weeks.  She describes trying to run away abruptly as her mother was trying to kill a spider and has had pain ever since exerting this leg.  She denies falls or hyperextension type of vividly during this episode but she states she felt a pop at her lateral knee joint space when this incident occurred.  She denies falls or sensation that the knee is weak or will buckle, pain is worse with range of motion and weightbearing.  She has used ice and elevation, rest without significant improvement in her symptoms.  She does have orthopedic care in [facility].  The history is provided by the patient.      Past Medical History:  Diagnosis Date   Migraines     There are no problems to display for this patient.   Past Surgical History:  Procedure Laterality Date   ADENOIDECTOMY     TONSILLECTOMY       OB History   No obstetric history on file.     Family History  Problem Relation Age of Onset   Healthy Mother     Social History   Tobacco Use   Smoking status: Never   Smokeless tobacco: Never  Substance Use Topics   Alcohol use: No   Drug use: No    Home Medications Prior to Admission medications   Medication Sig Start Date End Date Taking? Authorizing Provider  ibuprofen (ADVIL) 600 MG tablet Take 1 tablet (600 mg total) by mouth every 6 (six) hours as needed. 02/07/21   Burgess Amor, PA-C  medroxyPROGESTERone (DEPO-PROVERA) 150 MG/ML injection Inject 150 mg into the muscle every 3 (three) months. 11/30/17   [provider]  meloxicam (MOBIC) 15 MG tablet Take 1 tablet (15 mg total) by mouth daily. 05/30/18    Bast, Gloris Manchester A, NP  ondansetron (ZOFRAN ODT) 4 MG disintegrating tablet Take 1 tablet (4 mg total) by mouth every 8 (eight) hours as needed for nausea or vomiting. 02/14/18   Robinson, [place] N, PA-C  sucralfate (CARAFATE) 1 GM/10ML suspension Take 10 mLs (1 g total) by mouth 4 (four) times daily -  with meals and at bedtime. 02/14/18   Robinson, [place] N, PA-C  traMADol (ULTRAM) 50 MG tablet Take 1 tablet (50 mg total) by mouth every 6 (six) hours as needed. 05/30/18   Janace Aris, NP    Allergies    Patient has no known allergies.  Review of Systems   Review of Systems  Constitutional:  Negative for fever.  Musculoskeletal:  Positive for arthralgias. Negative for joint swelling and myalgias.  Neurological:  Negative for weakness and numbness.  All other systems reviewed and are negative.  Physical Exam Updated Vital Signs BP (!) 116/58 (BP Location: Right Arm)   Pulse 94   Temp 98.3 F (36.8 C) (Oral)   Resp 18   Ht 5\' 3"  (1.6 m)   Wt 102.1 kg   SpO2 97%   BMI 39.86 kg/m   Physical Exam Constitutional:      Appearance: She is well-developed.  HENT:     Head: Atraumatic.  Cardiovascular:     Comments: Pulses equal bilaterally Musculoskeletal:        General: Tenderness present.     Cervical back: Normal range of motion.     Right knee: No swelling, deformity, effusion, erythema or crepitus. Decreased range of motion. No LCL laxity, MCL laxity, ACL laxity or PCL laxity.     Comments: Persistent mild popping sensation lateral knee joint with full extension.  No ligament laxity appreciated, but there is increased pain at the lateral joint space with valgus strain.    Skin:    General: Skin is warm and dry.  Neurological:     Mental Status: She is alert.     Sensory: No sensory deficit.     Motor: No weakness.     Deep Tendon Reflexes: Reflexes normal.    ED Results / Procedures / Treatments   Labs (all labs ordered are listed, but only abnormal results are  displayed) Labs Reviewed - No data to display  EKG None  Radiology DG Knee Complete 4 Views Right  Result Date: 02/07/2021 CLINICAL DATA:  pain after twisting injury EXAM: RIGHT KNEE - COMPLETE 4+ VIEW COMPARISON:  None. FINDINGS: No evidence of fracture, dislocation, or joint effusion. No evidence of arthropathy or other focal bone abnormality. Soft tissues are unremarkable. IMPRESSION: Negative. Electronically Signed   By: Tish Frederickson M.D.   On: 02/07/2021 20:55    Procedures Procedures   Medications Ordered in ED Medications - No data to display  ED Course  I have reviewed the triage vital signs and the nursing notes.  Pertinent labs & imaging results that were available during my care of the patient were reviewed by me and considered in my medical decision making (see chart for details).    MDM Rules/Calculators/A&P                            Pain right lateral knee, exam suggesting possible knee sprain versus meniscal injury.  There is no instability about the knee.  She was given a knee sleeve for comfort, discussed ice and heat as home modalities, ibuprofen added, Plan follow-up with her orthopedist in [place] if symptoms persist or not improving with this treatment plan.   Final Clinical Impression(s) / ED Diagnoses Final diagnoses:  Acute pain of right knee    Rx / DC Orders ED Discharge Orders          Ordered    ibuprofen (ADVIL) 600 MG tablet  Every 6 hours PRN,   Status:  Discontinued        02/07/21 2119    ibuprofen (ADVIL) 600 MG tablet  Every 6 hours PRN        02/07/21 2158             Burgess Amor, Cordelia Poche 02/09/21 1535    Mancel Bale, MD 02/13/21 Rickey Primus

## 2021-10-20 NOTE — Telephone Encounter (Signed)
This encounter was created in error - please disregard.

## 2023-06-05 ENCOUNTER — Emergency Department
Admission: EM | Admit: 2023-06-05 | Discharge: 2023-06-06 | Disposition: A | Discharge status: Home / Self Care | Payer: BLUE CROSS/BLUE SHIELD | Attending: Emergency Medicine | Admitting: Emergency Medicine

## 2023-06-05 ENCOUNTER — Other Ambulatory Visit: Payer: Self-pay

## 2023-06-05 ENCOUNTER — Emergency Department: Payer: BLUE CROSS/BLUE SHIELD

## 2023-06-05 DIAGNOSIS — D62 Acute posthemorrhagic anemia: Secondary | ICD-10-CM | POA: Diagnosis not present

## 2023-06-05 DIAGNOSIS — R42 Dizziness and giddiness: Secondary | ICD-10-CM | POA: Diagnosis present

## 2023-06-05 DIAGNOSIS — N939 Abnormal uterine and vaginal bleeding, unspecified: Secondary | ICD-10-CM | POA: Insufficient documentation

## 2023-06-05 DIAGNOSIS — E876 Hypokalemia: Secondary | ICD-10-CM | POA: Diagnosis not present

## 2023-06-05 HISTORY — DX: Endometriosis, unspecified: N80.9

## 2023-06-05 LAB — CBC
HCT: 26.2 % — ABNORMAL LOW (ref 36.0–46.0)
Hemoglobin: 7.5 g/dL — ABNORMAL LOW (ref 12.0–15.0)
MCH: 22.9 pg — ABNORMAL LOW (ref 26.0–34.0)
MCHC: 28.6 g/dL — ABNORMAL LOW (ref 30.0–36.0)
MCV: 79.9 fL — ABNORMAL LOW (ref 80.0–100.0)
Platelets: 578 10*3/uL — ABNORMAL HIGH (ref 150–400)
RBC: 3.28 MIL/uL — ABNORMAL LOW (ref 3.87–5.11)
RDW: 15.3 % (ref 11.5–15.5)
WBC: 11.2 10*3/uL — ABNORMAL HIGH (ref 4.0–10.5)
nRBC: 0 % (ref 0.0–0.2)

## 2023-06-05 LAB — BASIC METABOLIC PANEL
Anion gap: 9 (ref 5–15)
BUN: 11 mg/dL (ref 6–20)
CO2: 25 mmol/L (ref 22–32)
Calcium: 9.1 mg/dL (ref 8.9–10.3)
Chloride: 102 mmol/L (ref 98–111)
Creatinine, Ser: 0.73 mg/dL (ref 0.44–1.00)
GFR, Estimated: >60 mL/min (ref >60)
Glucose, Bld: 88 mg/dL (ref 70–99)
Potassium: 3.2 mmol/L — ABNORMAL LOW (ref 3.5–5.1)
Sodium: 136 mmol/L (ref 135–145)

## 2023-06-05 LAB — PREPARE RBC (CROSSMATCH)

## 2023-06-05 LAB — ABO/RH: ABO/RH(D): O POS

## 2023-06-05 LAB — TROPONIN I (HIGH SENSITIVITY): Troponin I (High Sensitivity): <2 ng/L (ref <18)

## 2023-06-05 LAB — HCG, QUANTITATIVE, PREGNANCY: hCG, Beta Chain, Quant, S: <1 m[IU]/mL (ref <5)

## 2023-06-05 MED ORDER — MEDROXYPROGESTERONE ACETATE 10 MG PO TABS: 10.0000 mg | ORAL_TABLET | Freq: Every day | ORAL | 0 refills | Status: AC

## 2023-06-05 MED ORDER — SODIUM CHLORIDE 0.9 % IV SOLN: 10.0000 mL/h | Freq: Once | INTRAVENOUS | Status: DC

## 2023-06-05 NOTE — ED Provider Notes (Signed)
 [facility] Provider Note    Event Date/Time   First MD Initiated Contact with Patient 06/05/23 1841     (approximate)   History   Dizziness   HPI  Ariana Schroeder is a 28 year old female with history of endometriosis presenting to the emergency department for evaluation of dizziness.  She has a history of heavy menstrual cycles.  She had been receiving a Depo injection with improvement, but has not received this for the last 6 months due to lack of insurance coverage.  She does now have her insurance back and is scheduled to resume this.  During this time, she has had recurrent heavy periods.  Has been having vaginal bleeding for the past 3 weeks.  She has been changing her pads every 1-3 hours.  Has had a couple episodes of dizziness over the past 2 weeks, but in the last 2 days, has had near daily episodes of dizziness described as lightheadedness.  No syncope.  Has not previously required a blood transfusion.  Does not have an OB/GYN.  Physical Exam   Triage Vital Signs: ED Triage Vitals  Encounter Vitals Group     BP 06/05/23 1711 (!) 120/103     Systolic BP Percentile --      Diastolic BP Percentile --      Pulse Rate 06/05/23 1711 95     Resp 06/05/23 1711 20     Temp 06/05/23 1711 98.2 F (36.8 C)     Temp Source 06/05/23 1711 Oral     SpO2 06/05/23 1711 100 %     Weight 06/05/23 1713 248 lb (112.5 kg)     Height 06/05/23 1713 5\' 3"  (1.6 m)     Head Circumference --      Peak Flow --      Pain Score 06/05/23 1710 0     Pain Loc --      Pain Education --      Exclude from Growth Chart --     Most recent vital signs: Vitals:   06/05/23 2100 06/05/23 2115  BP: 106/65   Pulse: 91 (!) 102  Resp:    Temp:    SpO2: 100% 100%     General: Awake, interactive  CV:  Regular rate, good peripheral perfusion.  Resp:  Unlabored respirations, lungs clear to auscultation Abd:  Nondistended, soft, nontender to palpation GU:  Patient  declined Neuro:  Symmetric facial movement, fluid speech   ED Results / Procedures / Treatments   Labs (all labs ordered are listed, but only abnormal results are displayed) Labs Reviewed  BASIC METABOLIC PANEL - Abnormal; Notable for the following components:      Result Value   Potassium 3.2 (*)    All other components within normal limits  CBC - Abnormal; Notable for the following components:   WBC 11.2 (*)    RBC 3.28 (*)    Hemoglobin 7.5 (*)    HCT 26.2 (*)    MCV 79.9 (*)    MCH 22.9 (*)    MCHC 28.6 (*)    Platelets 578 (*)    All other components within normal limits  HCG, QUANTITATIVE, PREGNANCY  POC URINE PREG, ED  TYPE AND SCREEN  PREPARE RBC (CROSSMATCH)  ABO/RH  TROPONIN I (HIGH SENSITIVITY)     EKG EKG independently reviewed interpreted by myself (ER attending) demonstrates:  EKG demonstrates normal sinus rhythm rate of 91, PR 164, QRS 76, QTc 430, no acute ST changes  RADIOLOGY Imaging independently reviewed and interpreted by myself demonstrates:  CXR without focal consolidation  PROCEDURES:  Critical Care performed: Yes, see critical care procedure note(s)  CRITICAL CARE Performed by: Trinna Post   Total critical care time: 32 minutes  Critical care time was exclusive of separately billable procedures and treating other patients.  Critical care was necessary to treat or prevent imminent or life-threatening deterioration.  Critical care was time spent personally by me on the following activities: development of treatment plan with patient and/or surrogate as well as nursing, discussions with consultants, evaluation of patient's response to treatment, examination of patient, obtaining history from patient or surrogate, ordering and performing treatments and interventions, ordering and review of laboratory studies, ordering and review of radiographic studies, pulse oximetry and re-evaluation of patient's condition.  Procedures   MEDICATIONS  ORDERED IN ED: Medications  0.9 %  sodium chloride infusion (has no administration in time range)     IMPRESSION / MDM / ASSESSMENT AND PLAN / ED COURSE  I reviewed the triage vital signs and the nursing notes.  Differential diagnosis includes, but is not limited to, acute blood loss anemia in setting of vaginal bleeding, anemia, electrolyte abnormality, pneumonia, pneumothorax  Patient's presentation is most consistent with acute presentation with potential threat to life or bodily function.  28 year old female presenting to the emergency department for evaluation of lightheadedness in the setting of heavy vaginal bleeding.  Blood work demonstrates acute anemia with hemoglobin of 7.5, previously normal a few years ago.  BMP with mild hypokalemia.  I did discuss admission given her symptomatic anemia, but patient quite hesitant about this.  She declined pelvic exam.  Case was reviewed with Dr. Feliberto Gottron with GYN.  As patient's bleeding had slowed here, she did feel that patient could be given a unit of blood and discharged with close outpatient follow-up in the clinic early next week, or the patient could be admitted for observation.  Patient did prefer to be discharged with strict return precautions and close outpatient follow-up.  Will plan for DC after transfusion of 1 unit of PRBC.      FINAL CLINICAL IMPRESSION(S) / ED DIAGNOSES   Final diagnoses:  Acute blood loss anemia  Lightheadedness  Abnormal uterine bleeding     Rx / DC Orders   ED Discharge Orders          Ordered    medroxyPROGESTERone (PROVERA) 10 MG tablet  Daily        06/05/23 2324             Note:  This document was prepared using Dragon voice recognition software and may include unintentional dictation errors.   Trinna Post, MD 06/05/23 [phone]

## 2023-06-05 NOTE — Discharge Instructions (Addendum)
 You were seen in the ER today for evaluation of your lightheadedness in the setting of your vaginal bleeding.  I suspect your lightheadedness is due to anemia from your blood loss.  We gave you a unit of blood here.  Please follow-up closely with the OB/GYN team in clinic next week.  If you have not heard from them by Monday, please call the number above to confirm your appointment.  Return to the ER for new or worsening symptoms including worsening lightheadedness, passing out, or any other new or concerning symptoms.

## 2023-06-05 NOTE — ED Triage Notes (Signed)
 Pt to ED stating has been having intermittent dizziness since 1 week and went to UC and they told her orthostatic changes: BP dropped and HR went up with standing. Also c/o period has lasted 3 weeks, hx endometriosis.  Was getting depo shots to control vag bleeding but has been off since 1 year, started back having periods 9/24. Also c/o back pain, HA, chest pain and SOB. Not dizzy right now. Skin dry.

## 2023-06-06 DIAGNOSIS — D62 Acute posthemorrhagic anemia: Secondary | ICD-10-CM | POA: Diagnosis not present

## 2023-06-06 NOTE — ED Provider Notes (Signed)
-----------------------------------------   4:28 AM on 06/06/2023 -----------------------------------------   Transfusion completed.  Patient remains hemodynamically stable, voices no complaints currently.  She will follow-up closely with GYN.  Strict return precautions given.  Patient verbalizes understanding and agrees with plan of care.   Irean Hong, MD 06/06/23 [phone]

## 2023-06-07 LAB — TYPE AND SCREEN
ABO/RH(D): O POS
Antibody Screen: NEGATIVE
Unit division: 0

## 2023-06-07 LAB — BPAM RBC
Blood Product Expiration Date: 202503222359
ISSUE DATE / TIME: 202502230008
Unit Type and Rh: 202503222359
Unit Type and Rh: 5100

## 2023-08-12 ENCOUNTER — Ambulatory Visit
Admission: RE | Admit: 2023-08-12 | Discharge: 2023-08-12 | Disposition: A | Discharge status: Home / Self Care | Payer: Self-pay | Source: Ambulatory Visit | Attending: Physician Assistant | Admitting: Physician Assistant

## 2023-08-12 VITALS — BP 111/81 | HR 78 | Temp 98.7°F | Ht 63.0 in | Wt 238.0 lb

## 2023-08-12 DIAGNOSIS — S46911A Strain of unspecified muscle, fascia and tendon at shoulder and upper arm level, right arm, initial encounter: Secondary | ICD-10-CM | POA: Diagnosis not present

## 2023-08-12 DIAGNOSIS — M7701 Medial epicondylitis, right elbow: Secondary | ICD-10-CM

## 2023-08-12 MED ORDER — KETOROLAC TROMETHAMINE 60 MG/2ML IM SOLN
30.0000 mg | Freq: Once | INTRAMUSCULAR | Status: AC
  Administered 2023-08-12: 30 mg via INTRAMUSCULAR

## 2023-08-12 MED ORDER — PREDNISONE 10 MG PO TABS: ORAL_TABLET | ORAL | 0 refills | Status: AC

## 2023-08-12 MED ORDER — TIZANIDINE HCL 4 MG PO TABS: 4.0000 mg | ORAL_TABLET | Freq: Every evening | ORAL | 0 refills | Status: AC | PRN

## 2023-08-12 NOTE — ED Provider Notes (Signed)
 [facility]    CSN: [id number] Arrival date & time: 08/12/23  1200      History   Chief Complaint Chief Complaint  Patient presents with   Arm Injury    HPI Ariana Schroeder is a 28 y.o. female presenting for right shoulder and right elbow pain x 2 weeks. 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 She does state she had a fall and injured her right arm 3 years ago, but has not really had any issues since then. Patient reports that she cannot really raise the shoulder without significant discomfort. Also reports that straightening the elbow is painful. No neck pain, back pain, numbness/weakness or tingling. Patient has been taking Dual Action Tylenol /Advil  without relief. Symptoms have worsened and she is having issues sleeping at night due to the pain. She is right hand dominant. No other concerns.  HPI  Past Medical History:  Diagnosis Date   Endometriosis    Migraines     There are no active problems to display for this patient.   Past Surgical History:  Procedure Laterality Date   ADENOIDECTOMY     TONSILLECTOMY      OB History   No obstetric history on file.      Home Medications    Prior to Admission medications   Medication Sig Start Date End Date Taking? Authorizing Provider  medroxyPROGESTERone  (DEPO-PROVERA ) 150 MG/ML injection Inject 150 mg into the muscle every 3 (three) months. 11/30/17  Yes [provider]  predniSONE  (DELTASONE ) 10 MG tablet Take 6 tabs p.o. on day 1 and decrease by 1 tablet daily until complete 08/12/23  Yes Nancy Axon B, PA-C  tiZANidine  (ZANAFLEX ) 4 MG tablet Take 1 tablet (4 mg total) by mouth at bedtime as needed for up to 14 days for muscle spasms. 08/12/23 08/26/23 Yes Floydene Hy, PA-C  medroxyPROGESTERone  (PROVERA ) 10 MG tablet Take 1 tablet (10 mg total) by mouth daily. 06/05/23   Claria Crofts, MD  sucralfate  (CARAFATE ) 1 GM/10ML suspension  Take 10 mLs (1 g total) by mouth 4 (four) times daily -  with meals and at bedtime. 02/14/18   Robinson, [place] N, PA-C    Family History Family History  Problem Relation Age of Onset   Healthy Mother     Social History Social History   Tobacco Use   Smoking status: Never   Smokeless tobacco: Never  Substance Use Topics   Alcohol use: No   Drug use: No     Allergies   Patient has no known allergies.   Review of Systems Review of Systems  Musculoskeletal:  Positive for arthralgias. Negative for back pain, joint swelling, neck pain and neck stiffness.  Skin:  Negative for color change and wound.  Neurological:  Negative for weakness and numbness.     Physical Exam Triage Vital Signs ED Triage Vitals  Encounter Vitals Group     BP      Systolic BP Percentile      Diastolic BP Percentile      Pulse      Resp      Temp      Temp src      SpO2      Weight      Height      Head Circumference      Peak Flow      Pain Score      Pain Loc  Pain Education      Exclude from Growth Chart    No data found.  Updated Vital Signs BP 111/81 (BP Location: Left Arm)   Pulse 78   Temp 98.7 F (37.1 C) (Oral)   Ht 5\' 3"  (1.6 m)   Wt 238 lb (108 kg)   LMP 05/15/2023   SpO2 99%   BMI 42.16 kg/m      Physical Exam Vitals and nursing note reviewed.  Constitutional:      General: She is not in acute distress.    Appearance: Normal appearance. She is not ill-appearing or toxic-appearing.  HENT:     Head: Normocephalic and atraumatic.  Eyes:     General: No scleral icterus.       Right eye: No discharge.        Left eye: No discharge.     Conjunctiva/sclera: Conjunctivae normal.  Cardiovascular:     Rate and Rhythm: Normal rate.  Pulmonary:     Effort: Pulmonary effort is normal. No respiratory distress.  Musculoskeletal:     Right shoulder: Tenderness (generalized tenderness of entire shoulder) present. No swelling. Decreased range of motion (reduced to 90  degrees flexion and abduction). Normal strength.     Right elbow: No swelling. Decreased range of motion (decreased extension). Tenderness present in medial epicondyle and olecranon process.     Right forearm: Normal.     Right wrist: Normal.     Cervical back: Neck supple. No bony tenderness. No pain with movement. Normal range of motion.     Comments: TTP right trapezius muscle   Skin:    General: Skin is dry.  Neurological:     General: No focal deficit present.     Mental Status: She is alert. Mental status is at baseline.     Motor: No weakness.     Gait: Gait normal.  Psychiatric:        Mood and Affect: Mood normal.        Behavior: Behavior normal.      UC Treatments / Results  Labs (all labs ordered are listed, but only abnormal results are displayed) Labs Reviewed - No data to display  EKG   Radiology No results found.  Procedures Procedures (including critical care time)  Medications Ordered in UC Medications  ketorolac  (TORADOL ) injection 30 mg (30 mg Intramuscular Given 08/12/23 1257)    Initial Impression / Assessment and Plan / UC Course  I have reviewed the triage vital signs and the nursing notes.  Pertinent labs & imaging results that were available during my care of the patient were reviewed by me and considered in my medical decision making (see chart for details).   28 y/o female presents for right shoulder pain and right elbow pain x 2 weeks. Denies injury. She does report a lot of lifting of children on Easter when she played the [employer] Bunny. No neck pain, numbness/tingling/weakness of arm. Has reduced ROM of shoulder and elbow.   Explained to patient that she likely  has a strain of her shoulder and epicondylitis/tendinitis of elbow due to overuse recently. Since she has not have improvement with OTC meds, will start on corticosteroid taper. Patient given 30 mg IM ketorolac  in clinic for acute pain relief. Tizanidine  prn at bed time. Reviewed RICE  guidelines. Reviewed exercises. Advised ortho and PT follow up.    Final Clinical Impressions(s) / UC Diagnoses   Final diagnoses:  Strain of right shoulder, initial encounter  Medial epicondylitis of  right elbow     Discharge Instructions      - Strain of your shoulder and tendinitis of your elbow. - Begin corticosteroid taper.  May also continue Tylenol .  I sent a muscle relaxer for use at bedtime to hopefully you are able to sleep. - Try to stretch and exercise the shoulder as tolerated. -Avoid painful movements. - If not feeling better over the next week please consider following up with orthopedics and physical therapy.  You have a condition requiring you to follow up with Orthopedics so please call one of the following office for appointment:   [facility] Address: [address], [place], [place] [address] Phone: [phone]  [facility] [address], [place], [place] [address] Phone: [phone]  [facility] [address] [place], [place] [address] Phone: [phone]   [facility] [address], [place] [address] Phone: [phone]       ED Prescriptions     Medication Sig Dispense Auth. Provider   predniSONE  (DELTASONE ) 10 MG tablet Take 6 tabs p.o. on day 1 and decrease by 1 tablet daily until complete 21 tablet Nancy Axon B, PA-C   tiZANidine  (ZANAFLEX ) 4 MG tablet Take 1 tablet (4 mg total) by mouth at bedtime as needed for up to 14 days for muscle spasms. 14 tablet Floydene Hy, PA-C      I have reviewed the PDMP during this encounter.   Floydene Hy, PA-C 08/12/23 [phone]

## 2023-08-12 NOTE — Discharge Instructions (Addendum)
-   Strain of your shoulder and tendinitis of your elbow. - Begin corticosteroid taper.  May also continue Tylenol .  I sent a muscle relaxer for use at bedtime to hopefully you are able to sleep. - Try to stretch and exercise the shoulder as tolerated. -Avoid painful movements. - If not feeling better over the next week please consider following up with orthopedics and physical therapy.  You have a condition requiring you to follow up with Orthopedics so please call one of the following office for appointment:   [facility] Address: [address], [place], [place] [address] Phone: [phone]  [facility] [address] [place], [place] [address] Phone: [phone]  [facility] [address], [place], [place] [address] Phone: [phone]   [facility] [address], [place] [address] Phone: [phone]

## 2023-08-12 NOTE — ED Triage Notes (Signed)
 Pt c/o right shoulder and elbow pain x2weeks  Pt states that she had a fall 3 years ago and injured the right elbow and shoulder. Pt states that she was told she had nerve damage but never got tested for nerve damage.  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 Pt then had right arm pain, starting in the shoulder and elbow, but is now shooting toward the wrist.  Pt has pain when moving her arm and states that she can barely move her arm without pain.

## 2024-01-18 ENCOUNTER — Emergency Department
Admission: EM | Admit: 2024-01-18 | Discharge: 2024-01-18 | Disposition: A | Discharge status: Home / Self Care | Attending: Emergency Medicine | Admitting: Emergency Medicine

## 2024-01-18 ENCOUNTER — Emergency Department

## 2024-01-18 ENCOUNTER — Other Ambulatory Visit: Payer: Self-pay

## 2024-01-18 DIAGNOSIS — M79672 Pain in left foot: Secondary | ICD-10-CM | POA: Insufficient documentation

## 2024-01-18 MED ORDER — KETOROLAC TROMETHAMINE 30 MG/ML IJ SOLN
30.0000 mg | Freq: Once | INTRAMUSCULAR | Status: AC
Start: 2024-01-18 — End: 2024-01-18
  Administered 2024-01-18: 30 mg via INTRAMUSCULAR
  Filled 2024-01-18: qty 1

## 2024-01-18 MED ORDER — MELOXICAM 15 MG PO TABS
15.0000 mg | ORAL_TABLET | Freq: Every day | ORAL | 0 refills | Status: DC
Start: 2024-01-18 — End: 2024-01-18

## 2024-01-18 MED ORDER — MELOXICAM 15 MG PO TABS: 15.0000 mg | ORAL_TABLET | Freq: Every day | ORAL | 0 refills | Status: AC

## 2024-01-18 NOTE — ED Triage Notes (Signed)
 Patient states left foot pain; reports this started after moving into a new house with stairs.

## 2024-01-18 NOTE — Discharge Instructions (Signed)
 Please follow-up with your primary care provider.   Get plenty of rest.  Apply ice to the affected area, elevate foot is much as possible.  Pain control:  Ibuprofen  (motrin /aleve/advil ) - You can take 3 tablets (600 mg) every 6 hours as needed for pain/fever.   Do not take prescribed medication meloxicam  with ibuprofen .  Acetaminophen  (tylenol ) - You can take 2 extra strength tablets (1000 mg) every 6 hours as needed for pain/fever.  You can alternate these medications or take them together.  Make sure you eat food/drink water when taking these medications.

## 2024-01-18 NOTE — ED Provider Notes (Signed)
 [facility] Emergency Department Provider Note     Event Date/Time   First MD Initiated Contact with Patient 01/18/24 1113     (approximate)   History   Foot Pain   HPI  Ariana Schroeder is a 28 y.o. female presents to the ED for evaluation of left foot pain x 1 month.  Patient reports onset of pain occurred while moving into her new house.  She describes the pain is localized to her 2nd-5th toes characterized as dull and aching with occasional shooting like pain that radiates to the top of her foot.  She reports intermittent sensation of numbness to the bottom of her foot.  Patient reports she has to walk on her heel for relief.  Has been taking Tylenol  with some relief.  Denies known injury.    Physical Exam   Triage Vital Signs: ED Triage Vitals  Encounter Vitals Group     BP 01/18/24 1041 127/84     Girls Systolic BP Percentile --      Girls Diastolic BP Percentile --      Boys Systolic BP Percentile --      Boys Diastolic BP Percentile --      Pulse Rate 01/18/24 1041 91     Resp 01/18/24 1041 16     Temp 01/18/24 1041 98.4 F (36.9 C)     Temp Source 01/18/24 1041 Oral     SpO2 01/18/24 1041 98 %     Weight 01/18/24 1040 245 lb (111.1 kg)     Height 01/18/24 1040 5' 3 (1.6 m)     Head Circumference --      Peak Flow --      Pain Score 01/18/24 1040 8     Pain Loc --      Pain Education --      Exclude from Growth Chart --     Most recent vital signs: Vitals:   01/18/24 1041  BP: 127/84  Pulse: 91  Resp: 16  Temp: 98.4 F (36.9 C)  SpO2: 98%    General Awake, no distress.  HEENT NCAT.  CV:  Good peripheral perfusion.  RESP:  Normal effort.  ABD:  No distention.  Other:  No visible deformity to left foot.  Tenderness to palpation over metatarsal phalangeal joint of digit 2-4.  Sensation is intact.  Pedal pulses palpated 2+.  Full range of motion of ankle joint.  Limited range of motion secondary to pain of toe flexion.  Antalgic gait favoring left foot. Walking on heel or foot.     ED Results / Procedures / Treatments   Labs (all labs ordered are listed, but only abnormal results are displayed) Labs Reviewed - No data to display  RADIOLOGY  I personally viewed and evaluated these images as part of my medical decision making, as well as reviewing the written report by the radiologist.  ED Provider Interpretation: Normal left foot   DG Foot Complete Left Result Date: 01/18/2024 CLINICAL DATA:  Left foot pain for 1 month. EXAM: LEFT FOOT - COMPLETE 3+ VIEW COMPARISON:  None Available. FINDINGS: The joint spaces are normal. No arthropathic findings. No acute bony abnormality bone lesion. IMPRESSION: Normal left foot radiographs. Electronically Signed   By: MYRTIS Stammer M.D.   On: 01/18/2024 11:44    PROCEDURES:  Critical Care performed: No  Procedures  MEDICATIONS ORDERED IN ED: Medications  ketorolac  (TORADOL ) 30 MG/ML injection 30 mg (30 mg Intramuscular Given 01/18/24 1151)  IMPRESSION / MDM / ASSESSMENT AND PLAN / ED COURSE  I reviewed the triage vital signs and the nursing notes.                              Clinical Course as of 01/18/24 1202  Tue Jan 18, 2024  1148 DG Foot Complete Left IMPRESSION: Normal left foot radiographs. [MH]    Clinical Course User Index [MH] Margrette Monte A, PA-C    28 y.o. female presents to the emergency department for evaluation and treatment of left foot pain. See HPI for further details.   Differential diagnosis includes, but is not limited to fracture, plantar fasciitis, Morton's neuroma  Patient's presentation is most consistent with acute complicated illness / injury requiring diagnostic workup.  Patient is alert and oriented.  She is hemodynamic stable.  On initial assessment patient is well-appearing.  Foot exam is overall benign.  There is no visible deformity.  X-rays obtained in triage is reassuring.  Given history, I suspect presentation  is consistent with Morton's neuroma.  Patient is placed in a walking boot and provided with crutches to assist with ambulation.  RICE education therapy discussed and patient verbalized understanding.  She is stable condition for discharge home.  She is encouraged to follow-up with her primary care provider in 1 week for further management.  ED return precaution discussed.  FINAL CLINICAL IMPRESSION(S) / ED DIAGNOSES   Final diagnoses:  Foot pain, left   Rx / DC Orders   ED Discharge Orders          Ordered    meloxicam  (MOBIC ) 15 MG tablet  Daily        01/18/24 1152           Note:  This document was prepared using Dragon voice recognition software and may include unintentional dictation errors.MERLINDA Margrette, Jaymi Tinner A, PA-C 01/18/24 1205    Arlander Charleston, MD 01/18/24 [phone]

## 2024-05-03 ENCOUNTER — Other Ambulatory Visit: Payer: Self-pay | Admitting: Podiatry

## 2024-05-03 DIAGNOSIS — M79672 Pain in left foot: Secondary | ICD-10-CM

## 2024-05-09 ENCOUNTER — Ambulatory Visit: Admission: RE | Admit: 2024-05-09 | Source: Ambulatory Visit

## 2024-05-12 ENCOUNTER — Ambulatory Visit
